# Patient Record
Sex: Female | Born: 1960 | Race: Black or African American | Hispanic: No | Marital: Single
Health system: Southern US, Community
[De-identification: ages and names within clinical notes are randomized; demographics above are authoritative.]

---

## 2003-02-03 ENCOUNTER — Inpatient Hospital Stay (HOSPITAL_COMMUNITY): Admission: AD | Admit: 2003-02-03 | Discharge: 2003-02-05 | Payer: Self-pay | Admitting: *Deleted

## 2003-02-04 ENCOUNTER — Encounter: Payer: Self-pay | Admitting: *Deleted

## 2003-02-22 ENCOUNTER — Inpatient Hospital Stay (HOSPITAL_COMMUNITY): Admission: RE | Admit: 2003-02-22 | Discharge: 2003-02-24 | Payer: Self-pay | Admitting: *Deleted

## 2003-03-23 ENCOUNTER — Ambulatory Visit (HOSPITAL_COMMUNITY): Admission: RE | Admit: 2003-03-23 | Discharge: 2003-03-23 | Payer: Self-pay | Admitting: *Deleted

## 2003-03-23 ENCOUNTER — Encounter: Payer: Self-pay | Admitting: *Deleted

## 2006-06-26 ENCOUNTER — Ambulatory Visit: Payer: Self-pay | Admitting: Internal Medicine

## 2006-12-11 ENCOUNTER — Ambulatory Visit: Payer: Self-pay | Admitting: Internal Medicine

## 2006-12-17 ENCOUNTER — Ambulatory Visit (HOSPITAL_COMMUNITY): Admission: RE | Admit: 2006-12-17 | Discharge: 2006-12-17 | Payer: Self-pay | Admitting: Internal Medicine

## 2006-12-22 ENCOUNTER — Encounter (INDEPENDENT_AMBULATORY_CARE_PROVIDER_SITE_OTHER): Payer: Self-pay | Admitting: Internal Medicine

## 2007-03-10 ENCOUNTER — Telehealth (INDEPENDENT_AMBULATORY_CARE_PROVIDER_SITE_OTHER): Payer: Self-pay | Admitting: Internal Medicine

## 2007-04-27 ENCOUNTER — Encounter (INDEPENDENT_AMBULATORY_CARE_PROVIDER_SITE_OTHER): Payer: Self-pay | Admitting: Internal Medicine

## 2007-12-11 ENCOUNTER — Encounter (INDEPENDENT_AMBULATORY_CARE_PROVIDER_SITE_OTHER): Payer: Self-pay | Admitting: Internal Medicine

## 2007-12-11 ENCOUNTER — Telehealth (INDEPENDENT_AMBULATORY_CARE_PROVIDER_SITE_OTHER): Payer: Self-pay | Admitting: *Deleted

## 2007-12-14 ENCOUNTER — Ambulatory Visit (HOSPITAL_COMMUNITY): Admission: RE | Admit: 2007-12-14 | Discharge: 2007-12-14 | Payer: Self-pay | Admitting: Internal Medicine

## 2007-12-14 ENCOUNTER — Ambulatory Visit: Payer: Self-pay | Admitting: Internal Medicine

## 2007-12-14 DIAGNOSIS — I10 Essential (primary) hypertension: Secondary | ICD-10-CM | POA: Insufficient documentation

## 2007-12-14 DIAGNOSIS — J309 Allergic rhinitis, unspecified: Secondary | ICD-10-CM | POA: Insufficient documentation

## 2007-12-15 ENCOUNTER — Telehealth (INDEPENDENT_AMBULATORY_CARE_PROVIDER_SITE_OTHER): Payer: Self-pay | Admitting: *Deleted

## 2007-12-16 ENCOUNTER — Encounter (INDEPENDENT_AMBULATORY_CARE_PROVIDER_SITE_OTHER): Payer: Self-pay | Admitting: Internal Medicine

## 2008-04-05 ENCOUNTER — Encounter (INDEPENDENT_AMBULATORY_CARE_PROVIDER_SITE_OTHER): Payer: Self-pay | Admitting: Internal Medicine

## 2008-04-13 ENCOUNTER — Telehealth (INDEPENDENT_AMBULATORY_CARE_PROVIDER_SITE_OTHER): Payer: Self-pay | Admitting: *Deleted

## 2008-04-13 ENCOUNTER — Ambulatory Visit: Payer: Self-pay | Admitting: Internal Medicine

## 2008-04-13 DIAGNOSIS — R109 Unspecified abdominal pain: Secondary | ICD-10-CM

## 2008-04-13 DIAGNOSIS — K8020 Calculus of gallbladder without cholecystitis without obstruction: Secondary | ICD-10-CM | POA: Insufficient documentation

## 2008-04-14 ENCOUNTER — Ambulatory Visit (HOSPITAL_COMMUNITY): Admission: RE | Admit: 2008-04-14 | Discharge: 2008-04-14 | Payer: Self-pay | Admitting: Internal Medicine

## 2008-04-22 ENCOUNTER — Ambulatory Visit (HOSPITAL_COMMUNITY): Admission: RE | Admit: 2008-04-22 | Discharge: 2008-04-22 | Payer: Self-pay | Admitting: General Surgery

## 2008-04-22 ENCOUNTER — Encounter (INDEPENDENT_AMBULATORY_CARE_PROVIDER_SITE_OTHER): Payer: Self-pay | Admitting: General Surgery

## 2008-12-13 ENCOUNTER — Telehealth (INDEPENDENT_AMBULATORY_CARE_PROVIDER_SITE_OTHER): Payer: Self-pay | Admitting: Internal Medicine

## 2008-12-13 ENCOUNTER — Ambulatory Visit: Payer: Self-pay | Admitting: Internal Medicine

## 2008-12-14 ENCOUNTER — Ambulatory Visit (HOSPITAL_COMMUNITY): Admission: RE | Admit: 2008-12-14 | Discharge: 2008-12-14 | Payer: Self-pay | Admitting: Internal Medicine

## 2009-03-09 ENCOUNTER — Telehealth (INDEPENDENT_AMBULATORY_CARE_PROVIDER_SITE_OTHER): Payer: Self-pay | Admitting: Internal Medicine

## 2009-03-14 ENCOUNTER — Encounter (INDEPENDENT_AMBULATORY_CARE_PROVIDER_SITE_OTHER): Payer: Self-pay | Admitting: Internal Medicine

## 2009-03-15 ENCOUNTER — Telehealth (INDEPENDENT_AMBULATORY_CARE_PROVIDER_SITE_OTHER): Payer: Self-pay | Admitting: Internal Medicine

## 2009-05-02 ENCOUNTER — Telehealth (INDEPENDENT_AMBULATORY_CARE_PROVIDER_SITE_OTHER): Payer: Self-pay | Admitting: *Deleted

## 2009-06-05 ENCOUNTER — Ambulatory Visit: Payer: Self-pay | Admitting: Internal Medicine

## 2009-06-05 DIAGNOSIS — R635 Abnormal weight gain: Secondary | ICD-10-CM | POA: Insufficient documentation

## 2009-06-05 DIAGNOSIS — R5381 Other malaise: Secondary | ICD-10-CM

## 2009-06-05 DIAGNOSIS — R5383 Other fatigue: Secondary | ICD-10-CM

## 2009-06-06 LAB — CONVERTED CEMR LAB
BUN: 14 mg/dL (ref 6–23)
Basophils Absolute: 0 10*3/uL (ref 0.0–0.1)
Basophils Relative: 0 % (ref 0–1)
Calcium: 9.9 mg/dL (ref 8.4–10.5)
Creatinine, Ser: 0.92 mg/dL (ref 0.40–1.20)
Eosinophils Absolute: 0.2 10*3/uL (ref 0.0–0.7)
Eosinophils Relative: 4 % (ref 0–5)
HCT: 40.6 % (ref 36.0–46.0)
Lymphs Abs: 1.6 10*3/uL (ref 0.7–4.0)
MCHC: 33.5 g/dL (ref 30.0–36.0)
MCV: 88.5 fL (ref 78.0–100.0)
Neutrophils Relative %: 57 % (ref 43–77)
Platelets: 210 10*3/uL (ref 150–400)
RDW: 14.1 % (ref 11.5–15.5)
WBC: 5.1 10*3/uL (ref 4.0–10.5)

## 2011-01-13 ENCOUNTER — Encounter: Payer: Self-pay | Admitting: Internal Medicine

## 2011-05-07 NOTE — Op Note (Signed)
Dana Vazquez, Dana Vazquez              ACCOUNT NO.:  [id number]   MEDICAL RECORD NO.:  [id number]          PATIENT TYPE:  AMB   LOCATION:  DAY                           FACILITY:  [facility]   PHYSICIAN:  Tilford Pillar, MD      DATE OF BIRTH:  04/28/1961   DATE OF PROCEDURE:  04/22/2008  DATE OF DISCHARGE:                               OPERATIVE REPORT   PREOPERATIVE DIAGNOSES:  1. Acute cholecystitis.  2. Cholelithiasis.   POSTOPERATIVE DIAGNOSES:  1. Acute cholecystitis.  2. Cholelithiasis.   PROCEDURE:  Laparoscopic cholecystectomy.   SURGEON:  Tilford Pillar, MD.   ANESTHESIA:  General endotracheal, local anesthetic 1% Sensorcaine  plain.   SPECIMEN:  Gallbladder.   ESTIMATED BLOOD LOSS:  Less than 100 mL.   INDICATIONS:  The patient is a 50 year old female who presented to my  office with a history of right upper quadrant epigastric abdominal pain.  She had had prior workup already demonstrating the presence of  gallbladder stones.  Based on her symptomatology and suspected acute  cholecystitis, risks, benefits, and alternatives of laparoscopic,  possible open cholecystectomy were discussed at length with the patient  including, but not limited to risk of bleeding, infection, bile leak,  small bowel injury, bile duct injury as well as the possibility of  intraoperative cardiac or pulmonary events.  The patient's questions and  concerns were addressed and the patient was consented for the planned  procedure.   OPERATION:  The patient was taken to the operating and was placed in  supine position on the operating table, at which time a general  anesthetic was administered.  Once the patient was asleep, she was  endotracheally intubated by anesthesia.  At this point, the patient's  abdomen was prepped and draped in the usual fashion.  A stab incision  was created supraumbilically with an #11 blade scalpel.  Additional  dissection down through subcuticular tissue was carried out  using a  Kocher clamp, which was utilized to grasp the anterior abdominal wall  fascia and lifted anteriorly.  A Veress needle was inserted.  Saline  drop test was utilized to confirm intraperitoneal placement, and at this  point, pneumoperitoneum was initiated.  Once sufficient pneumoperitoneum  was obtained, an 11-mm trocar was placed over a laparoscope allowing  visualization of the trocar entering into the peritoneal cavity.  At  this point, the inner cannula was removed.  The laparoscope was  reinserted.  There was no evidence of any trocar or Veress needle  placement injury.  At this time, the remaining trocars were placed in a  similar fashion with a skin incision and placement of the trocars under  direct visualization.  An 11-mm trocar was placed in the epigastrium, a  5-mm trocar was placed in the midline between the two 11-mm trocars, and  a 5-mm trocar was placed in the right lateral abdominal wall.  At this  point, the patient was placed in a reverse Trendelenburg left lateral  decubitus position.  There were multiple adhesions of omental fat around  the gallbladder, also adherent to the right lobe of the  liver.  Majority  of these were taken down with combination of blunt and electrocautery  dissection to prevent capsular tear on the liver.  The gallbladder was  acutely inflamed.  It was thickened and was difficult to grasp with a  grasper.  Due to this, a Weck needle was brought to this field and was  utilized to aspirate the gallbladder.  A small amount of thick, somewhat  purulent-appearing fluid was obtained.  This did allow for some  decompression of the gallbladder and allowed for better grasping of the  fundus, which was then elevated up and over the right lobe of the liver.  The infundibulum was identified.  Blunt dissection with a [place]  dissector was utilized to dissect the cystic duct free.  Once this was  free, a window was created around it allowing  visualization of the  entire cystic duct entering into the infundibulum of the gallbladder.  Similarly, the cystic artery was also identified.  A window was created  behind this.  Two EndoClips were placed proximally on the cystic artery  and one distally and the cystic artery was divided between two most  distal clips .  At this point, the cystic duct was similarly ligated  with the EndoClips, three proximally and one distally, and the cystic  artery was divided between two most distal clips.  At this point,  electrocautery was brought to the field was utilized to dissect the  gallbladder free from the gallbladder fossa.  Upon freeing the  gallbladder, it was placed in an Endocatch bag, which was placed up and  over the right lobe of the liver.  At this point, electrocautery was  utilized to obtain hemostasis in the gallbladder fossa.  A suction  irrigator was utilized to irrigate the field with copious amount of  sterile saline until the returning aspirate was relatively clear.  A  piece of Surgicel was placed into the gallbladder fossa to assist with  any hemostasis, and at this point, the patient was returned to a supine  position and the attention was turned to closure.   At this point, using an Endoclose suture passing device, a 2-0 Vicryl  suture was passed through both 11-mm trocar sites.  With the sutures in  place, the gallbladder was grasped and it was attempted to be removed  through the epigastric trocar site.  During this, additional sharp and  blunt dissection was required to enlarge the epigastric trocar site  sufficiently to allow removal of the gallbladder.  During this process,  both the previously placed Vicryl suture as well as the Endocatch bag  were inadvertently cut.  Both were removed.  The gallbladder was removed  eventually through the epigastric trocar site outside of the Endocatch  bag.  Due to this, the wound was copiously irrigated with sterile saline  and  the pneumoperitoneum was evacuated.  A 0 Vicryl suture was utilized  to reapproximate the fascia at the epigastric trocar sites  in a running  continuous fashion.  The wound was again irrigated until the returning  aspirate was clear and the umbilical Vicryl suture was secured.  At this  point, the local anesthetic was instilled.  A 4-0 Monocryl was utilized  in a running subcuticular suture to reapproximate the skin edges at all  4 trocar sites.  The skin was washed and dried with a moistened dry  towel.  Benzoin was applied around the incision.  Half-inch Steri-Strips  were placed.  Drapes removed.  The patient was allowed to come out of  general aesthetic and transferred back to a regular hospital bed.  She  was transferred to the Postanesthetic Care Unit in stable condition.  At  the conclusion of the procedure, all instrument, sponge, and needle  counts were correct.  The patient tolerated the procedure well.      Tilford Pillar, MD  Electronically Signed     BZ/MEDQ  D:  04/22/2008  T:  04/23/2008  Job:  [id number]   cc:   Erle Crocker, M.D.

## 2011-05-07 NOTE — H&P (Signed)
Dana Vazquez, Dana Vazquez              ACCOUNT NO.:  [id number]   MEDICAL RECORD NO.:  [id number]          PATIENT TYPE:  AMB   LOCATION:  DAY                           FACILITY:  [facility]   PHYSICIAN:  Tilford Pillar, MD      DATE OF BIRTH:  1961-03-01   DATE OF ADMISSION:  DATE OF DISCHARGE:  LH                              HISTORY & PHYSICAL   CHIEF COMPLAINT:  Gallstones.   HISTORY OF PRESENT ILLNESS:  The patient is a 50 year old female who has  a history of recently increasing episodes of emesis.  She has denied any  hematemesis.  She has had no fever or chills.  She has had no sick  contacts.  This has increased over the last 1 week.  She has had  increasing abdominal pain as well.  She actually states that the pain  typically comes on prior to her bouts of nausea.  Pain is localized to  the epigastrium and right upper quadrant.  There is no  additional  radiation.  There is no history of prior similar pain in the past.  She  also states that her pain sometimes gets better with an episode of  emesis, although it does not completely resolve it.  She has had noted  some bloating, but states that this has actually improved after a  hysterectomy in 2003.  She does have a history of constipation  especially over the last 3 weeks.  She denies any melena or  hematochezia.   PAST MEDICAL HISTORY:  Pertinent for hypertension.   PAST SURGICAL HISTORY:  1. She has had a prior myotomy.  2. Fibroid resection.  3. Hysterectomy.   MEDICATIONS:  She is on Climara patch.   ALLERGIES:  1. PENICILLIN.  She was told that it caused an anaphylactic reaction      when she was a child.  2. She is also allergic to [facility].   SOCIAL HISTORY:  No tobacco, no alcohol use.  She had one distant  pregnancy.   FAMILY HISTORY:  Positive for family history of biliary disease in the  mom's sister and a grandmother.  No history of biliary cancers or GI  cancers.   REVIEW OF SYSTEMS:  CONSTITUTIONAL:   Unremarkable.  EYES:  Unremarkable.  EARS/NOSE/THROAT: Occasional rhinorrhea associated with allergic  rhinitis.  RESPIRATORY:  Unremarkable.  CARDIOVASCULAR:  Unremarkable.  GASTROINTESTINAL:  As per HPI, otherwise unremarkable.  GENITOURINARY:  Unremarkable.  MUSCULOSKELETAL:  Unremarkable.  SKIN:  Unremarkable.  ENDOCRINE:  Unremarkable.  NEURO:  Unremarkable.   PHYSICAL EXAMINATION:  GENERAL:  The patient is a healthy-appearing,  moderately obese female who is calm on presentation.  She is not in any  acute distress.  She is alert and oriented x3.  HEENT:  Scalp:  No deformities, no masses.  Eyes:  Pupils are equal,  round, reactive.  Extraocular movements are intact.  No scleral icterus  or conjunctival pallor is noted.  Oral mucosa is pink.  Normal  occlusion.  NECK:  Trachea is midline.  No cervical lymphadenopathy is appreciated.  PULMONARY:  Unlabored respirations.  No  wheezes, no crackles.  She is  clear to auscultation bilaterally in both bilateral lung fields.  CARDIOVASCULAR:  Regular rate and rhythm.  No murmurs or gallops are  appreciated on presentation today.  EXTREMITIES:  She has 2+ radial pulses bilaterally.  ABDOMEN:  She has positive bowel sounds.  Abdomen is soft, obese.  She  does have right upper quadrant tenderness and epigastric tenderness.  She does have a Murphy sign elicited with deep inspiration.  No hernias  or masses are apparent.  SKIN:  Warm and dry.   DIAGNOSTICS:  The patient does have a right upper quadrant ultrasound  which demonstrates positive stones.   ASSESSMENT/PLAN:  Cholelithiasis.  Based on the patient's current workup  and her clinical presentation, I am suspicious that the majority of her  symptomatology is related to her gallbladder.  However, with the  symptomatology of improving pain with emesis, it is slightly less  characteristic for biliary disease.  As the patient has not had any  upper endoscopy or upper GI tract workup to  date, this was discussed  with the patient as the possible etiology such as gastritis or peptic  ulcer disease.  At this point, I have discussed at length the risks,  benefits and alternatives of a laparoscopic possible open  cholecystectomy including, but not limited to bleeding, infection, bile  leak, bile duct injury, small bowel injury as well as possibility of  intraoperative pulmonary cardiac events.  At this time, I do think it is  reasonable to proceed with laparoscopic possible open cholecystectomy  with the understanding that should she has continued to have any upper  intestinal symptomatology, additional workup with gastroenterology be  undertaken.  The patient is aware of this and does wish to proceed.      Tilford Pillar, MD  Electronically Signed     BZ/MEDQ  D:  04/20/2008  T:  04/20/2008  Job:  [id number]   cc:   Tilford Pillar, MD  Fax: [phone]   Erle Crocker, M.D.

## 2011-05-10 NOTE — H&P (Signed)
NAME:  Dana Vazquez, Dana Vazquez                        ACCOUNT NO.:  [id number]   MEDICAL RECORD NO.:  [id number]                   PATIENT TYPE:  INP   LOCATION:  A418                                 FACILITY:  [facility]   PHYSICIAN:  Langley Gauss, M.D.                DATE OF BIRTH:  1961/03/01   DATE OF ADMISSION:  02/03/2003  DATE OF DISCHARGE:                                HISTORY & PHYSICAL   HISTORY OF PRESENT ILLNESS:  This is a 50 year old gravida 0, para 0 who is  referred to the office on February 03, 2003.  Consultation is requested by  [facility].  The patient's chief complaint is that of heavy vaginal  bleeding that a.m. and passed a fist sized mass this morning.  The bleeding  actually started the p.m. prior.  The patient states she had used an entire  bag of plus sized maxi pads and was wearing an adult diaper upon  presentation.  She also felt somewhat dizzy and weak with poor energy level.   The patient states she typically bleeds for eight to 14 days each month with  both bright red bleeding as well as passage of clots.  The patient's history  is that she is well-known to me with previously identified uterine fibroids  with significant menometrorrhagia resulting in previous episodes of anemia.  The patient was most recently seen April 2003 at which time a complete  evaluation was performed.  At that time she had complained of progressively  heavier menstrual periods for one and one-half years, typically bleeding for  eight days with passage of golf ball sized clots.  Hematocrit at that time  was 32 and ultrasound revealed multiple uterine fibroids measuring as large  as 5 cm in diameter.  She was treated short-term at that time with Ovcon 35  followed by Provera 10 mg p.o. daily in an effort to result in shrinkage of  the leiomyoma while she made preparations for surgical management.  The  patient at that time was unprepared to surgery due to insurance  restrictions.   Thus, she continued with the Provera 10 mg p.o. daily for  several months.  Thereafter, was lost to follow-up and we did not see her  again until we were contacted to see her on February 03, 2003 at which time  she was noted to be uncontrollably bleeding with a hemoglobin of 5.1 in the  office and symptomatic anemia.   The patient's complete history is reviewed.  Initially she was seen June  2001 as an ER unassigned patient with significant symptomatic anemia with a  hemoglobin of 6 and pelvic ultrasound revealing multiple fibroids, largest  being 8.6 cm in diameter.  The patient was transfused 2 units at that time  in preparation for proceeding with TAH, BSO.  However, after stabilization  for the planned surgery the patient declined to proceed with the  hysterectomy stating  that she was concerned that hysterectomy would spoil  her nature.  Thus, she considered alternative therapies and actually went to  [facility] at which time fibroid artery embolization was performed there.  The patient states that this gave her significant improvement for only three  months' duration after which time the heavy bleeding resumed and a repeat  MRI, in fact, showed that not all the fibroids had shrunk.  The patient thus  has had somewhat significant menometrorrhagia since that point in time.  The  patient's past surgical history pertinent for a myomectomy in 1990.  At that  time patient was interested in maintaining her fertility potential.  However, now at age 67 with symptomatic anemia and enlarging fibroids,  patient is no longer concerned with childbearing potential and she is  prepared at this time to proceed with definitive surgical management.   ALLERGIES:  The patient states she is allergic to PENICILLIN which gives her  hives and TEQUIN.   PAST OB HISTORY:  She is a gravida 0, para 0.   REVIEW OF SYSTEMS:  Pertinent for no significant weight change.  She is  complaining of heavy vaginal  bleeding with passage of clots and cramping.  She is symptomatic with significant weakness and occasional dizziness.  She  denies any chest pain.  She denies any symptoms of genuine stress urinary  incontinence.  Denies any blood stools or any problems with constipation or  diarrhea.   PHYSICAL EXAMINATION:  GENERAL:  Obese black female.  VITAL SIGNS:  Most recent weight available 169.  Blood pressure is 117/57,  pulse rate of 95, respiratory rate 18, temperature 97.5.  HEENT:  Negative.  No adenopathy.  Mucous membranes are moist.  NECK:  Supple.  Thyroid is nonpalpable.  LUNGS:  Clear.  CARDIOVASCULAR:  Regular rate and rhythm.  ABDOMEN:  Soft and nontender.  There is obvious palpable pelvic mass  primarily to the left of the midline extending about 16 weeks size.  There  is a previous Pfannenstiel incision from prior myomectomy.  EXTREMITIES:  Normal.  PELVIC:  Normal external genitalia.  No lesions or ulcerations identified.  Bladder is well supported.  Cervix is noted to be without lesions.  Bimanual  examination reveals a lobular uterus irregular in contour, predominantly  largest left portion of the fundus at 14-16 weeks size.  The adnexa, of  course, are not palpably separatable from the pelvic mass which is presumed  to be a leiomyoma.   LABORATORIES:  Previous chart is reviewed which does reveal a hemoglobin of  10.0 April 15, 2002.  Pelvic ultrasound performed June 11, 2000 revealed  normal appearing ovaries bilaterally, multiple uterine fibroids measuring up  to 8.3 cm in size.  Pertinently, this was performed prior to the  embolization of the fibroids.  She did have a chest x-ray performed October 17, 1999 with findings of minimal peribronchial thickening.  Laboratory  studies this admission reveal reticulocyte count of 2.0.  Electrolytes  reveal slightly low potassium of 3.3.  Serum hCG is negative.  Hemoglobin 5.4, hematocrit 18.4, MCV 61.6, platelet count 97,000.  O+  blood type with  negative antibody screening.  On examination patient was noted to have  active vaginal bleeding with cervical clots present within the posterior  vaginal vault and moderate amount of dark red bleeding coming from the  cervix itself.   ASSESSMENT:  A 50 year old gravida 0, para 0 now with recurrence and  enlargement of previously identified uterine fibroids.  The  patient has  significant bleeding from there over long-time duration and have now  resulted in significant anemia with a hemoglobin of 5.4.  The patient is  prepared to proceed with definitive surgical management at this time.  However, she will require medical stabilization on an emergent basis prior  to this.  She is referred to [facility] for admission at which time  she will be immediately transfused and be hydrated with intravenous fluids.  Will make an effort to short-term stop her active bleeding by treating her  with Premarin 25 mg intravenous q.6h.  A pelvic ultrasound can be performed  on February 04, 2003 in an effort to ascertain with certainty whether this  is just uterine fibroids or involves any ovarian etiology.   If the patient continues to have active bleeding during this hospitalization  period, may likely proceed with definitive surgical therapy on February 05, 2002 with hysterectomy performed after appropriate transfusion is  administered.  If the patient were to stop having vaginal bleeding she could  be discharged and the surgery scheduled in the very near future.                                               Langley Gauss, M.D.    DC/MEDQ  D:  02/04/2003  T:  02/04/2003  Job:  [id number]

## 2011-05-10 NOTE — H&P (Signed)
NAME:  Dana Vazquez, Dana Vazquez                        ACCOUNT NO.:  [id number]   MEDICAL RECORD NO.:  [id number]                   PATIENT TYPE:  INP   LOCATION:  A331                                 FACILITY:  [facility]   PHYSICIAN:  Langley Gauss, M.D.                DATE OF BIRTH:  04-Jan-1961   DATE OF ADMISSION:  02/22/2003  DATE OF DISCHARGE:  02/24/2003                                HISTORY & PHYSICAL   HISTORY OF PRESENT ILLNESS:  The patient is a 50 year old gravida 0, para 0  with a diagnosis of irregular menses with resultant anemia and large uterine  fibroids.  The patient admitted at this time for definitive surgical  management to consist of a TAH/BSO.  The patient understands that removal of  the ovaries will result in onset of surgical menopause requiring initiation  of estrogen therapy to avoid symptoms associated with surgical menopause.  The patient's history is pertinent for a previous myomectomy in 1990.  Thereafter, the patient was seen again in July 2001, at which time she was  again noted to have problems with the fibroids and vaginal bleeding.  She  was transfused 2 units at that time in anticipation of proceeding with  surgery; however, the patient elected to travel to [place], at which time  uterine artery embolization was performed.  The patient states that her  bleeding never completely regulated following the uterine artery  embolization but during the past year she has had increasing problems with  very heavy and prolonged menses and feeling weak and dizzy.  In fact, this  is what prompted her most recent visit to the office due to the symptomatic  anemia.   The patient is status post admission February 03, 2003 to February 05, 2003  for blood transfusions.  She was treated with 3 units of packed red blood  cells at that time.  Her initial hemoglobin prior to transfusion was 5.4.  The patient had been treated with 400 mg of IM Depo-Provera at time of  discharge in an effort to suppress the fibroids and vaginal bleeding.  This  functioned very well, such that at time of discharge the patient has only  had over the past several weeks episodes of spotting.   ALLERGIES:  The patient states she is allergic to PENICILLIN, which gives  her hives, and also TEQUIN.   MEDICATIONS:  1. Hemocyte-F p.o. daily.  2. Depo-Provera 400 mg IM received February 04, 2003.  3. The patient has just completed a Z-Pak for upper respiratory infection.   PHYSICAL EXAMINATION:  GENERAL:  She is noted to be a pleasant black female  in no acute distress.  VITAL SIGNS:  Weight is 169, blood pressure 114/70, pulse rate of 80,  respiratory rate of 16.  HEENT:  Negative.  No adenopathy.  Neck is supple.  Mucous membranes are  moist.  Thyroid is nonpalpable.  LUNGS:  Clear.  CARDIOVASCULAR:  Regular rate and rhythm.  ABDOMEN:  Soft and nontender.  The patient does have a palpable 16-week size  pelvic mass, irregular lobular in contour on the left greater than on the  right.  She has previous Pfannenstiel incision from prior myomectomy.  PELVIC:  Normal external genitalia.  No lesions or ulcerations identified.  The bladder is well supported and all urethras attached.  The cervix is  without lesions with a class I Pap smear.  Bimanual examination confirms the  16-week size of fibroids with at least two palpable fibroids, one within the  left side of the uterine fundus and a second within the midportion of the  uterus.  Also noted is an exophytic fibroid associated with the left uterine  fundus.   LABORATORY DATA:  Laboratory studies at time of initial presentation:  Hemoglobin 11.6, hematocrit 36.5.  Platelet count has improved to 367,000.   ASSESSMENT:  A 50 year old black female who desires definitive surgical  management for her symptomatic fibroids which have on two occasions resulted  in severe anemia.  The patient is prepared to proceed with total  abdominal  hysterectomy/bilateral salpingo-oophorectomy.  Risks and benefits of surgery  were discussed with the patient at time of evaluation in the office and we  will be proceeding on February 22, 2003.                                               Langley Gauss, M.D.    DC/MEDQ  D:  03/01/2003  T:  03/01/2003  Job:  [id number]

## 2011-05-10 NOTE — Discharge Summary (Signed)
NAME:  Dana Vazquez, Dana Vazquez                        ACCOUNT NO.:  [id number]   MEDICAL RECORD NO.:  [id number]                   PATIENT TYPE:  INP   LOCATION:  A331                                 FACILITY:  [facility]   PHYSICIAN:  Langley Gauss, M.D.                DATE OF BIRTH:  [birth year]   DATE OF ADMISSION:  02/22/2003  DATE OF DISCHARGE:  02/24/2003                                 DISCHARGE SUMMARY   PROCEDURE PERFORMED:  Total abdominal hysterectomy/bilateral salpingo-  oophorectomy.   DISCHARGE INSTRUCTIONS:  On 02/24/03, the patient was given a copy of the  standardized discharge instructions. She will follow up in the office in  three days' time for removal follow up staples from the Pfannenstiel  incision. JP drain was removed at the time of discharge.   DISCHARGE MEDICATIONS:  1. Mepergan one p.o. q.4-6h. p.r.n. postoperative pain.  2. The patient will continue with Hemocyte-F one p.o. daily.  3. An addition, she is started on Wellbutrin SR 150 mg po daily, stating she     is feeling mildly depressed and had previously done well taking     Wellbutrin.   PERTINENT LABORATORY DATA:  Initial hemoglobin and hematocrit 11.6/36.5; on  postoperative day #1, hemoglobin 10.2, hematocrit 31.6; and on postoperative  day #2, hemoglobin 7.8, hematocrit 24.1 with a platelet count of 227,000.  Electrolytes are within normal limits. Liver function tests within normal  limits. O+ blood type. Negative antibody screen.   HOSPITAL COURSE:  The patient had an uncomplicated TAH/BSO performed on  02/22/03 with removal of very large uterine fibroids. Technically difficulty  due to the large fibroids limiting visualization and mobility.  Postoperatively, the patient had clear yellow urine from her Foley catheter.  She did have a Climara patch placed in the recovery room. The patient also  had a JP drain within the subcutaneous base. Postoperatively, the patient  remained afebrile. Vital signs  remained stable. She had no postoperative  bleeding following equilibration with the hemoglobin of 7.8. The patient did  very well with no significant signs or symptoms of anemia, undoubtedly due  to her preceding history of severe anemia with a hemoglobin of 5.4 on 2/12.  The patient was able to advance her diet to regular general. At the time of  discharge, the patient is ambulatory, tolerating a regular general diet. She  has began passing flatus. The patient abdomen is soft and nontender,  nondistended, and the patient is doing well on p.o. Mepergan. Pertinently,  the patient was difficulty to manage postoperatively as she had complaints  of itching, either from Toradol, Buprenex, or thereafter p.o. Tylox. Thus,  at time of discharge, Mepergan was chosen for postoperative pain relief.   DISPOSITION:  As stated previously. The patient is to follow up in three  days' time for removal of staples. Final pathology is currently pending from  the operative procedure.  Langley Gauss, M.D.   DC/MEDQ  D:  03/01/2003  T:  03/01/2003  Job:  [id number]

## 2011-05-10 NOTE — Op Note (Signed)
NAME:  Dana Vazquez, Dana Vazquez                        ACCOUNT NO.:  [id number]   MEDICAL RECORD NO.:  [id number]                   PATIENT TYPE:  INP   LOCATION:  A331                                 FACILITY:  [facility]   PHYSICIAN:  Langley Gauss, M.D.                DATE OF BIRTH:  11-Mar-1961   DATE OF PROCEDURE:  02/22/2003  DATE OF DISCHARGE:                                 OPERATIVE REPORT   PREOPERATIVE DIAGNOSES:  1. Irregular menses.  2. Anemia secondary to #1.  3. Multiple uterine leiomyoma.   POSTOPERATIVE DIAGNOSES:  1. Irregular menses.  2. Anemia secondary to #1.  3. Multiple uterine leiomyoma.   PROCEDURE:  1. Total abdominal hysterectomy.  2. Bilateral salpingo-oophorectomy.   SURGEON:  Roylene Reason. Lisette Grinder, M.D.   ESTIMATED BLOOD LOSS:  850 mL   SPECIMENS:  For permanent section only, include:  Fundal portion of uterus  with 2 large fibroids; uterine body, as well as the cervix, as a separate  specimen; and then both left and right tubes and ovaries, as a separate  specimen.   ANESTHESIA:  General endotracheal   DRAINS:  Foley catheter to straight drainage.  JP catheter within the  subcutaneous space.   FINDINGS:  Extensive adhesive disease secondary to a previous myomectomy.  There was noted to be adhesions between large bowel and the posterior aspect  of the uterus.  In addition there was noted to be omental adhesions to the  anterior abdominal wall.  Extensive dissection was performed with the  procedure made very difficult with the adhesive disease which had to be  lysed.  In addition, the very large fibroids limited mobilization and  visualization requiring performance of an initial excision of the 2 largest  fibroids from the fundus, followed by the remainder of the hysterectomy,  followed by proceeding with the bilateral salpingo-oophorectomy.  Total  operating time:  Starting at 0730 with leaving the OR at 10:15 a.m. for a  total of 2 hours and 45 minutes  for a difficult hysterectomy which typically  would have required only 1 hour and 30 minutes.   SUMMARY:  The patient was taken to the operating room.  Vital signs were  stable.  The patient underwent uncomplicated induction of general  endotracheal anesthesia after which time she was prepped and draped in the  usual sterile manner.  Foley catheter placed to straight drainage with  findings of clear yellow urine.  Abdominal examination revealed the large  pelvic mass, largest to the left of the midline extending to about 16 weeks  size.  A sharp knife was then used to incise a Pfannenstiel incision through  the skin through the previous operative site; dissected down to the fascial  plane utilizing a sharp knife, cauterizing bleeders along the way.  The  fascia was then incised in a transverse curvilinear manner while sharply  dissecting off the underlying rectus muscles. The  fascial edges were grasped  using straight Kocher clamps and the fascia was dissected off the underlying  rectus muscle in the midline both superiorly and inferiorly.  The multiple  small bleeders which were encountered were cauterized aggressively to  minimize intraoperative blood loss.  The rectus muscle was then bluntly  separated and dissected down to the pubic symphysis in the midline.  The  peritoneum was then elevated utilizing hemostats and the peritoneal cavity  was atraumatically, sharply entered with the peritoneal incision then  extended superiorly and inferiorly.   At this point in time extensive adhesive disease was encountered between  omentum and anterior abdominal wall requiring dissection with the Metzenbaum  scissors in the avascular plane to completely immobilize omentum.  Examination of the pelvic cavity, at this time revealed extensive adhesive  disease between large bowel and omentum and the posterior aspect of the  uterus.  Neither ovary is visualized secondary to adhesive disease keeping   the ovaries adhered to the posterior peritoneum.   With the limited visualization I did have, I proceeded in the usual manner  by identifying the round ligament.  The right round ligament is clamped with  a Kelly clamp followed by a suture ligature of #0 Vicryl in a Heaney  fashion, followed by sharp transection of the round ligament.  The  infundibulopelvic ligament is not identified, rather the utero-ovarian  ligament is identified.  A window is created in the free space of the broad  ligament and then this is doubly clamped with Kelly clamps followed by  doubly ligating with a suture ligature, first with #0 Vicryl free-tie,  followed by a suture ligature of #0 Vicryl in a Heaney fashion.   At this point in time, examination of the left side of the pelvic cavity  revealed the large fibroids identified there.  The two largest fibroids were  estimated at 7 cm in diameter x 5 cm in diameter x 8 cm in diameter. These  extended off the left fundal portion of the uterus and noted to be  exophytic.  I was unable to elevate these through the abdominal incision  which allows the uterus to be retracted anteriorly and sharp dissection  performed in the posterior aspect of the uterus to mobilize large bowel and  omentum off the posterior aspect of the uterus.   I then proceeded to identify the junction of the two largest fibroids with  the uterus itself.  This was a very thick vascular band which required  sequential clamping with a curved Heaney clamp followed by 4 separate  ligatures of #0 Vicryl in a Heaney fashion to completely excise this large  exophytic area of all fibroids.  These were handed off as a separate  specimen.  This then allows the left round ligament to be identified which  is then clamped with a Kelly clamp to control backbleeding, followed by a  suture ligature of #0 Vicryl in a Heaney fashion.  The left round ligament is then transected.  The left ovary is not identified.   The left utero-  ovarian ligament is doubly clamped with Kelly clamps, followed by being  doubly ligated with free-tie of #0 Vicryl followed by suture ligature of #0  Vicryl.   At this point in time, I was able to transect anterior across the lower  uterine segment to create a bladder flap in the vesicouterine fold and  utilizing sharp dissection and blunt manipulation this was mobilized towards  the distal aspect of  the cervix.  This extended bluntly down to the level of  the uterine vessels.  The right uterine vessel was clamped utilizing a Kelly  clamp to control backbleeding, followed by a curved Heaney clamp, followed  by a suture ligature of #0 Vicryl.  Some backbleeding was noted to occur as  the Kelly clamp slipped off  These required separate ligature with figure-of-  eights of #0 Vicryl on the uterus itself.  The left uterine vessel was  likewise identified and clamped with a curved Heaney clamp followed by a  suture ligature of #0 Vicryl in a simple fashion to secure the left uterine  vessel.  The cervix was then examined and noted to be very long with very  thick vascular tissue immediately adjacent to it.  On each side sequentially  straight Heaney clamps are utilized to secure first the cardinal ligaments  on each side followed by the uterosacral ligaments on each side.  This then  brought me down to the level of the vaginal angles.  Again, dissection was  performed anteriorly to be certain that the bladder flap was distal to the  cervix.  The right vaginal angle was clamped with a  curved Heaney clamp  followed by suture ligature of #0 Vicryl in a Heaney fashion.  The left  uterine vaginal angle was likewise secured in a similar fashion.  An  additional curved Heaney clamp was placed and secured, first on the right  then on the left to clamp distal to the cervix and clamped across the  vaginal cuff.  Each of these had been then secured with #0 Vicryl in a  Heaney fashion.  The entirety of the specimen is then removed.  The cervix  was examined and noted to be contained and connected to the uterus itself.   Examination of the secured vaginal cuff then revealed a very small arterial  bleeder near the left vaginal angle.  This is clamped with a Vanderbilt  clamp followed by a ligature of 2-0 Vicryl to secure hemostasis here.  In  addition there is small active venous bleeding from the midportion of the  vaginal cuff.  This is secured with a figure-of-eight suture of #0 Vicryl.  Irrigation is then performed until certainty is assured that the vaginal  cuff is then dry.   I then turned my attention to the removal of the ovaries.  Each of the  ovaries required sharp dissection to free it up from the posterior pelvic  peritoneum in the avascular plane.  After performing this each of the  infundibulopelvic pelvic ligaments had been identified and sequentially on each of these a curved Heaney clamp is placed followed by a suture ligature  of #0 Vicryl to secure hemostasis.  Great care is taken to remove the  entirety of the ovary, but at the same time remain very close to the ovary  to not involve the ureters which are noted to be in their normal anatomic  position in the lateral pelvic side wall.   After removal of the ovaries the infundibulopelvic ligaments were noted to  be dry and secure with no active vaginal bleeding.  Copious irrigation was  performed into the pelvic cavity until clear.  A piece of Surgicel is then  placed into the posterior pelvic peritoneum overlying the vaginal cuff.  The  Balfour retractor is then removed as well as the 3 moist packs which had  been utilized to mobilize out of the operative field.  Sponge  and instrument  counts are correct at this time.  The omentum is carefully examined for  evidence of any bleeding.  Several small venous bleeders are cauterized with  cautery in addition to this larger bleeders are required to be  clamped with  an endo______ clamp followed by free-tie of #0 Vicryl to secure hemostasis  on the omentum.   The peritoneal edges were then grasped using Kelly clamps.  The peritoneum  and overlying rectus muscle was then closed with a continuous running #0  chromic suture.  Again, irrigation is performed to assure hemostasis.  The  fascia was then closed with a continuous running, looped, #1 PDS suture to  secure the fascia.  The subcutaneous bleeders are cauterized.  A JP drain is  placed in the subcutaneous space with a separate exit wound to the left apex  of the incision.  Three interrupted #1 Maxon sutures are then placed through-  and-through the skin edges to function as retention-type sutures and the  skin is then closed utilizing skin staples.  To facilitate postoperative  analgesia a total of 20 mL of 0.5% bupivacaine plain is injected along the  entirety of the incision.  The patient tolerated the procedure well.  She  continues to drain clear yellow urine.  She has maintained adequate volume  status through the administration of crystalloid by the anesthesia staff.  No blood products are transfused.  JP drain appears to be draining well.  Foley catheter was draining clear yellow urine.  The patient is extubated  and taken to the recovery room in stable condition, at which time, operative  findings discussed with the patient's awaiting family.                                               Langley Gauss, M.D.    DC/MEDQ  D:  02/22/2003  T:  02/23/2003  Job:  [id number]

## 2011-05-10 NOTE — Discharge Summary (Signed)
NAME:  Dana Vazquez, Dana Vazquez                        ACCOUNT NO.:  [id number]   MEDICAL RECORD NO.:  [id number]                   PATIENT TYPE:  INP   LOCATION:  A418                                 FACILITY:  [facility]   PHYSICIAN:  Langley Gauss, M.D.                DATE OF BIRTH:  [birth year]   DATE OF ADMISSION:  02/03/2003  DATE OF DISCHARGE:  02/05/2003                                 DISCHARGE SUMMARY   DIAGNOSES:  1. Pelvic mass secondary to uterine leiomyoma.  2. Menometrorrhagia with anemia secondary to #1 secondary to blood loss.  3. Hypokalemia, corrected.  4. Thrombocytopenia.   DISPOSITION AT TIME OF DISCHARGE:  The patient is having minimal bleeding.  She will contact and return visit in our office in five days time, at which  time a CBC can be reassessed.  In addition, specifically will be looking at  her platelet count and her hemoglobin and hematocrit counts.   DISCHARGE MEDICATIONS:  1. Hemocyte-F one p.o. daily, #60 with no refill.  2. Tylox #20 with no refill for relief from uterine cramping.  3. In addition, the patient was treated during hospitalization with three     doses of 25 mg of IV Premarin to stop the active bleeding.  4. On 02/04/03 she received Depo-Provera 400 mg IM in an effort to induce an     atrophic endometrium.   PERTINENT LABORATORY STUDIES:  Initial labs on 02/03/03 revealed O+ blood  type with a negative antibody screen, hemoglobin 5.4, hematocrit 18.4, with  an MCV of 61.6, initial platelet count of 97,000.  The patient referred to  [facility] on 02/03/03.  She received 2 units of packed red blood cells and  intravenous fluids, and CBC was reassessed on 02/04/03, at which time her  hemoglobin was noted to be 7.3, hematocrit 23.7, with a platelet count of  69,000.  In addition, a potassium was obtained which revealed abnormal  electrolytes with a potassium of 3.3.  With these lab values in mind, the  patient was transfused a third unit of packed red  blood cells the p.m. of  02/04/03, and additional laboratory studies were requested.  She was started  on K-Tabs 40 mEq p.o. b.i.d. for the hypokalemia, but actually vomited  these.  Thus, she was treated only with 20 mEq of KCl per liter of IV  fluids.  On 02/04/03, the bleeding was noted to have markedly diminished.  She was having active vaginal bleeding, but passing only very small clots.  No large clots were identified.  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  An ultrasound obtained 02/04/03 revealed multiple  uterine fibroids, the largest being anterior, measured at 12.4 x 6.8 x 6.6  cm in size.  The left ovary was nonvisualized.  There was a thickened  endometrial  stripe at 1.9 cm.  The third unit was transfused on 12/05/03.  Late in the p.m. on 12/05/03, laboratory studies were again requested.  Final hemoglobin 8.0, hematocrit 25.0, with a platelet count of 73,000,  white count 6.0, MCV 71.4, potassium now normal at 4.1, fibrinogen normal at  238.  Bleeding time abnormal with normal value 2 to 8.5 minutes; hers was  noted to be greater than 15 minutes.  PT and PTT were within normal limits.  Pertinently on the peripheral smear, the platelets qualitatively appeared  decreased.  In addition, a reticulocyte count had been requested upon  admission and was noted to be 2.0%.  On 02/05/03, which is the day we had  hoped to proceed with total abdominal hysterectomy, this was not performed  secondary to patient's medical status with the thrombocytopenia abnormal  bleeding time; thus, with minimal active bleeding occurring at this time,  she was discharged to home to be followed closely as an outpatient with iron  replacement therapy.  Hopefully the Depo-Provera will suppress her  endometrium and prevent any additional heavy vaginal bleeding, such that her  bone marrow itself is able to  reproduce red cells and platelets.   As stated previously, the patient will follow up in five days time, at which  time we can reassess her laboratory values to make sure they are actually  increasing in the right direction.  The patient is in full understanding of  this plan.  She was offered the option of transfusion of platelets to  correct her counts, but between the two she prefers expectant management and  will actually try to return to work in two days' time, and if no additional  heavy bleeding occurs, hopefully we can proceed with the surgical procedure  in 1-1/2 to 2 weeks' time, pending improvement specifically in her platelet  count and bleeding time.   Discussion held with the patient at the time of discharge 30 minutes  regarding future plans.                                               Langley Gauss, M.D.    DC/MEDQ  D:  02/05/2003  T:  02/05/2003  Job:  [id number]

## 2011-09-17 LAB — CBC
Hemoglobin: 13.3
MCHC: 34.2
RBC: 4.39
RDW: 13.6

## 2011-09-17 LAB — BASIC METABOLIC PANEL
CO2: 26
Calcium: 9.5
Creatinine, Ser: 0.56
GFR calc Af Amer: >60
GFR calc non Af Amer: >60
Sodium: 138

## 2013-11-24 ENCOUNTER — Other Ambulatory Visit (HOSPITAL_COMMUNITY): Payer: Self-pay | Admitting: Internal Medicine

## 2013-11-24 DIAGNOSIS — Z139 Encounter for screening, unspecified: Secondary | ICD-10-CM

## 2013-11-25 ENCOUNTER — Ambulatory Visit (HOSPITAL_COMMUNITY)
Admission: RE | Admit: 2013-11-25 | Discharge: 2013-11-25 | Disposition: A | Discharge status: Home / Self Care | Payer: BC Managed Care – PPO | Source: Ambulatory Visit | Attending: Internal Medicine | Admitting: Internal Medicine

## 2013-11-25 DIAGNOSIS — Z139 Encounter for screening, unspecified: Secondary | ICD-10-CM

## 2013-11-25 DIAGNOSIS — Z1231 Encounter for screening mammogram for malignant neoplasm of breast: Secondary | ICD-10-CM | POA: Insufficient documentation

## 2013-11-29 ENCOUNTER — Ambulatory Visit (HOSPITAL_COMMUNITY): Payer: Self-pay

## 2014-05-05 ENCOUNTER — Encounter (INDEPENDENT_AMBULATORY_CARE_PROVIDER_SITE_OTHER): Payer: Self-pay | Admitting: *Deleted

## 2015-05-17 ENCOUNTER — Encounter (INDEPENDENT_AMBULATORY_CARE_PROVIDER_SITE_OTHER): Payer: Self-pay | Admitting: *Deleted

## 2015-06-09 ENCOUNTER — Other Ambulatory Visit (HOSPITAL_COMMUNITY): Payer: Self-pay | Admitting: Internal Medicine

## 2015-06-09 DIAGNOSIS — Z1231 Encounter for screening mammogram for malignant neoplasm of breast: Secondary | ICD-10-CM

## 2015-06-14 ENCOUNTER — Ambulatory Visit (INDEPENDENT_AMBULATORY_CARE_PROVIDER_SITE_OTHER): Payer: BC Managed Care – PPO | Admitting: Internal Medicine

## 2015-06-14 ENCOUNTER — Ambulatory Visit (HOSPITAL_COMMUNITY): Payer: BC Managed Care – PPO

## 2015-09-08 ENCOUNTER — Other Ambulatory Visit (HOSPITAL_COMMUNITY): Payer: Self-pay | Admitting: Internal Medicine

## 2015-09-08 DIAGNOSIS — Z1231 Encounter for screening mammogram for malignant neoplasm of breast: Secondary | ICD-10-CM

## 2015-09-13 ENCOUNTER — Ambulatory Visit (HOSPITAL_COMMUNITY): Payer: BC Managed Care – PPO

## 2015-10-05 ENCOUNTER — Encounter (INDEPENDENT_AMBULATORY_CARE_PROVIDER_SITE_OTHER): Payer: Self-pay | Admitting: *Deleted

## 2015-10-16 ENCOUNTER — Ambulatory Visit (HOSPITAL_COMMUNITY)
Admission: RE | Admit: 2015-10-16 | Discharge: 2015-10-16 | Disposition: A | Discharge status: Home / Self Care | Payer: BLUE CROSS/BLUE SHIELD | Source: Ambulatory Visit | Attending: Internal Medicine | Admitting: Internal Medicine

## 2015-10-16 DIAGNOSIS — Z1231 Encounter for screening mammogram for malignant neoplasm of breast: Secondary | ICD-10-CM | POA: Diagnosis present

## 2018-09-03 ENCOUNTER — Encounter (INDEPENDENT_AMBULATORY_CARE_PROVIDER_SITE_OTHER): Payer: Self-pay | Admitting: *Deleted

## 2018-09-09 ENCOUNTER — Other Ambulatory Visit (HOSPITAL_COMMUNITY): Payer: Self-pay | Admitting: Internal Medicine

## 2018-09-09 DIAGNOSIS — Z1239 Encounter for other screening for malignant neoplasm of breast: Secondary | ICD-10-CM

## 2018-09-23 ENCOUNTER — Ambulatory Visit (HOSPITAL_COMMUNITY)
Admission: RE | Admit: 2018-09-23 | Discharge: 2018-09-23 | Disposition: A | Discharge status: Home / Self Care | Payer: Managed Care, Other (non HMO) | Source: Ambulatory Visit | Attending: Internal Medicine | Admitting: Internal Medicine

## 2018-09-23 DIAGNOSIS — Z1239 Encounter for other screening for malignant neoplasm of breast: Secondary | ICD-10-CM | POA: Diagnosis not present

## 2019-07-06 ENCOUNTER — Other Ambulatory Visit: Payer: Self-pay

## 2019-07-06 ENCOUNTER — Other Ambulatory Visit: Payer: Managed Care, Other (non HMO)

## 2019-07-06 DIAGNOSIS — Z20822 Contact with and (suspected) exposure to covid-19: Secondary | ICD-10-CM

## 2019-07-11 LAB — NOVEL CORONAVIRUS, NAA: SARS-CoV-2, NAA: NOT DETECTED

## 2019-07-15 ENCOUNTER — Telehealth: Payer: Self-pay | Admitting: Internal Medicine

## 2019-07-15 NOTE — Telephone Encounter (Signed)
General/Other - results  °The patient was given negative Covid-19 results °

## 2019-12-14 ENCOUNTER — Ambulatory Visit: Payer: HRSA Program | Attending: Internal Medicine

## 2019-12-14 ENCOUNTER — Other Ambulatory Visit: Payer: Self-pay

## 2019-12-14 DIAGNOSIS — Z20822 Contact with and (suspected) exposure to covid-19: Secondary | ICD-10-CM

## 2019-12-14 DIAGNOSIS — Z20828 Contact with and (suspected) exposure to other viral communicable diseases: Secondary | ICD-10-CM | POA: Insufficient documentation

## 2019-12-15 LAB — NOVEL CORONAVIRUS, NAA: SARS-CoV-2, NAA: NOT DETECTED

## 2020-03-04 ENCOUNTER — Ambulatory Visit: Payer: Self-pay | Attending: Internal Medicine

## 2020-03-04 DIAGNOSIS — Z23 Encounter for immunization: Secondary | ICD-10-CM

## 2020-03-04 NOTE — Progress Notes (Signed)
   Covid-19 Vaccination Clinic  Name:  TRILBY WAY    MRN: [id number] DOB: [birth year]  03/04/2020  Ms. Boettcher was observed post Covid-19 immunization for 30 minutes based on pre-vaccination screening without incident. She was provided with Vaccine Information Sheet and instruction to access the V-Safe system.   Ms. Tibbett was instructed to call 911 with any severe reactions post vaccine: Marland Kitchen Difficulty breathing  . Swelling of face and throat  . A fast heartbeat  . A bad rash all over body  . Dizziness and weakness   Immunizations Administered    Name Date Dose VIS Date Route   Pfizer COVID-19 Vaccine 03/04/2020  8:41 AM 0.3 mL 12/03/2019 Intramuscular   Manufacturer: [employer], [employer]   Lot: [id number]   NDC: [address]-1

## 2020-03-25 ENCOUNTER — Ambulatory Visit: Payer: Self-pay | Attending: Internal Medicine

## 2020-03-25 DIAGNOSIS — Z23 Encounter for immunization: Secondary | ICD-10-CM

## 2020-03-25 NOTE — Progress Notes (Signed)
   Covid-19 Vaccination Clinic  Name:  Dana Vazquez    MRN: [id number] DOB: 11-20-1961  03/25/2020  Ms. Lalone was observed post Covid-19 immunization for 30 minutes based on pre-vaccination screening without incident. She was provided with Vaccine Information Sheet and instruction to access the V-Safe system.   Ms. Pellegrin was instructed to call 911 with any severe reactions post vaccine: Marland Kitchen Difficulty breathing  . Swelling of face and throat  . A fast heartbeat  . A bad rash all over body  . Dizziness and weakness   Immunizations Administered    Name Date Dose VIS Date Route   Pfizer COVID-19 Vaccine 03/25/2020  8:07 AM 0.3 mL 12/03/2019 Intramuscular   Manufacturer: [employer], [employer]   Lot: [id number]   NDC: [address]-1

## 2020-03-27 ENCOUNTER — Ambulatory Visit: Payer: Self-pay

## 2020-05-24 ENCOUNTER — Ambulatory Visit (HOSPITAL_COMMUNITY)
Admission: RE | Admit: 2020-05-24 | Discharge: 2020-05-24 | Disposition: A | Discharge status: Home / Self Care | Payer: BC Managed Care – PPO | Source: Ambulatory Visit | Attending: Internal Medicine | Admitting: Internal Medicine

## 2020-05-24 ENCOUNTER — Other Ambulatory Visit: Payer: Self-pay

## 2020-05-24 ENCOUNTER — Other Ambulatory Visit (HOSPITAL_COMMUNITY): Payer: Self-pay | Admitting: Internal Medicine

## 2020-05-24 DIAGNOSIS — M25512 Pain in left shoulder: Secondary | ICD-10-CM | POA: Insufficient documentation

## 2020-06-13 ENCOUNTER — Encounter: Payer: Self-pay | Admitting: Internal Medicine

## 2020-08-25 ENCOUNTER — Ambulatory Visit: Payer: BC Managed Care – PPO | Admitting: Gastroenterology

## 2020-10-21 ENCOUNTER — Other Ambulatory Visit: Payer: Self-pay

## 2020-10-21 ENCOUNTER — Ambulatory Visit: Payer: Self-pay | Attending: Internal Medicine

## 2020-10-21 DIAGNOSIS — Z23 Encounter for immunization: Secondary | ICD-10-CM

## 2020-10-21 NOTE — Progress Notes (Signed)
° °  Covid-19 Vaccination Clinic  Name:  Dana Vazquez    MRN: [id number] DOB: 1961/09/02  10/21/2020  [name] was observed post Covid-19 immunization for 15 minutes without incident. She was provided with Vaccine Information Sheet and instruction to access the V-Safe system.   [name] was instructed to call 911 with any severe reactions post vaccine:  Difficulty breathing   Swelling of face and throat   A fast heartbeat   A bad rash all over body   Dizziness and weakness

## 2021-07-06 ENCOUNTER — Other Ambulatory Visit (HOSPITAL_COMMUNITY): Payer: Self-pay | Admitting: Internal Medicine

## 2021-07-06 ENCOUNTER — Other Ambulatory Visit: Payer: Self-pay | Admitting: Internal Medicine

## 2021-07-06 DIAGNOSIS — Z1231 Encounter for screening mammogram for malignant neoplasm of breast: Secondary | ICD-10-CM

## 2021-07-12 ENCOUNTER — Ambulatory Visit (HOSPITAL_COMMUNITY)
Admission: RE | Admit: 2021-07-12 | Discharge: 2021-07-12 | Disposition: A | Discharge status: Home / Self Care | Payer: BC Managed Care – PPO | Source: Ambulatory Visit | Attending: Internal Medicine | Admitting: Internal Medicine

## 2021-07-12 ENCOUNTER — Other Ambulatory Visit: Payer: Self-pay

## 2021-07-12 ENCOUNTER — Other Ambulatory Visit (HOSPITAL_COMMUNITY): Payer: Self-pay | Admitting: Internal Medicine

## 2021-07-12 ENCOUNTER — Encounter (HOSPITAL_COMMUNITY): Payer: Self-pay

## 2021-07-12 DIAGNOSIS — N63 Unspecified lump in unspecified breast: Secondary | ICD-10-CM

## 2021-07-12 DIAGNOSIS — Z1231 Encounter for screening mammogram for malignant neoplasm of breast: Secondary | ICD-10-CM | POA: Insufficient documentation

## 2021-07-20 ENCOUNTER — Ambulatory Visit (HOSPITAL_COMMUNITY)
Admission: RE | Admit: 2021-07-20 | Discharge: 2021-07-20 | Disposition: A | Discharge status: Home / Self Care | Payer: BC Managed Care – PPO | Source: Ambulatory Visit | Attending: Internal Medicine | Admitting: Internal Medicine

## 2021-07-20 ENCOUNTER — Other Ambulatory Visit: Payer: Self-pay

## 2021-07-20 ENCOUNTER — Encounter (HOSPITAL_COMMUNITY): Payer: Self-pay

## 2021-07-20 DIAGNOSIS — N63 Unspecified lump in unspecified breast: Secondary | ICD-10-CM

## 2021-08-21 IMAGING — MG DIGITAL DIAGNOSTIC BILAT W/ TOMO W/ CAD
6 of 10 series · 6 of 30 positions shown · non-contrast
Comparison: Previous exam(s).

CLINICAL DATA: Patient has a small palpable lump along the lower
inner aspect of the right breast.

EXAM:
DIGITAL DIAGNOSTIC BILATERAL MAMMOGRAM WITH TOMOSYNTHESIS AND CAD;
ULTRASOUND RIGHT BREAST LIMITED
TECHNIQUE: Bilateral digital diagnostic mammography and breast tomosynthesis
was performed. The images were evaluated with computer-aided
detection.; Targeted ultrasound examination of the right breast was
performed

[L MLO synth-2D]
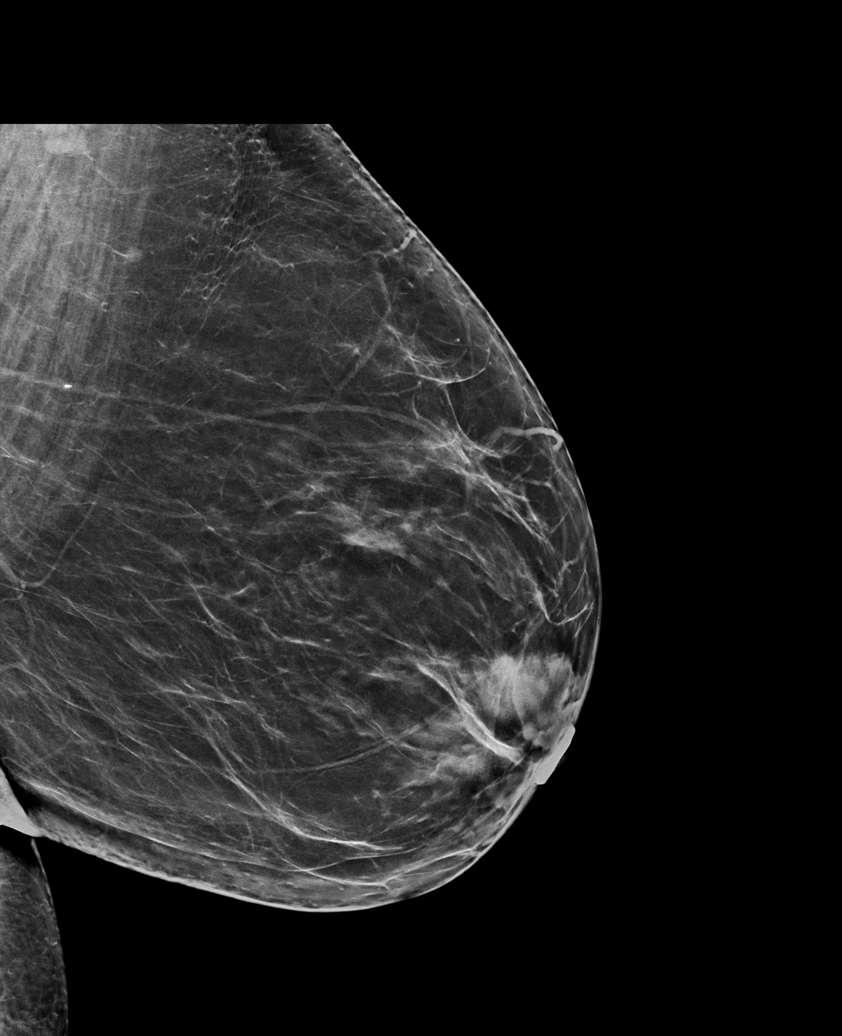

[R MLO synth-2D (1 of 2)]
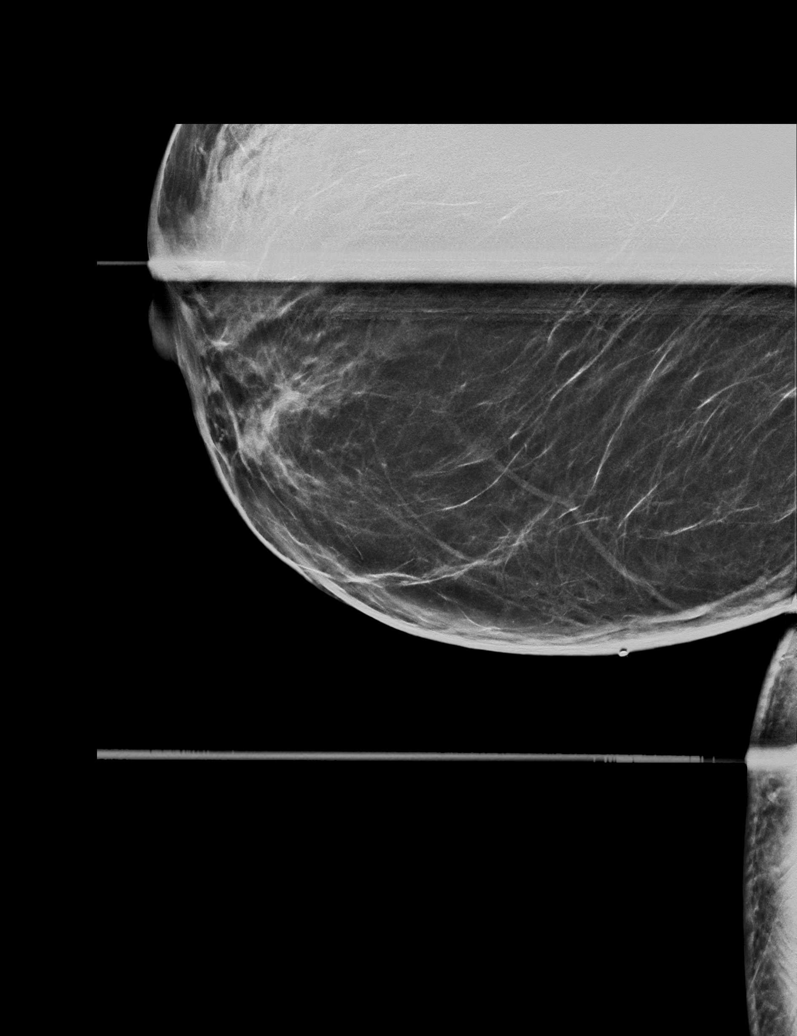

[L CC synth-2D]
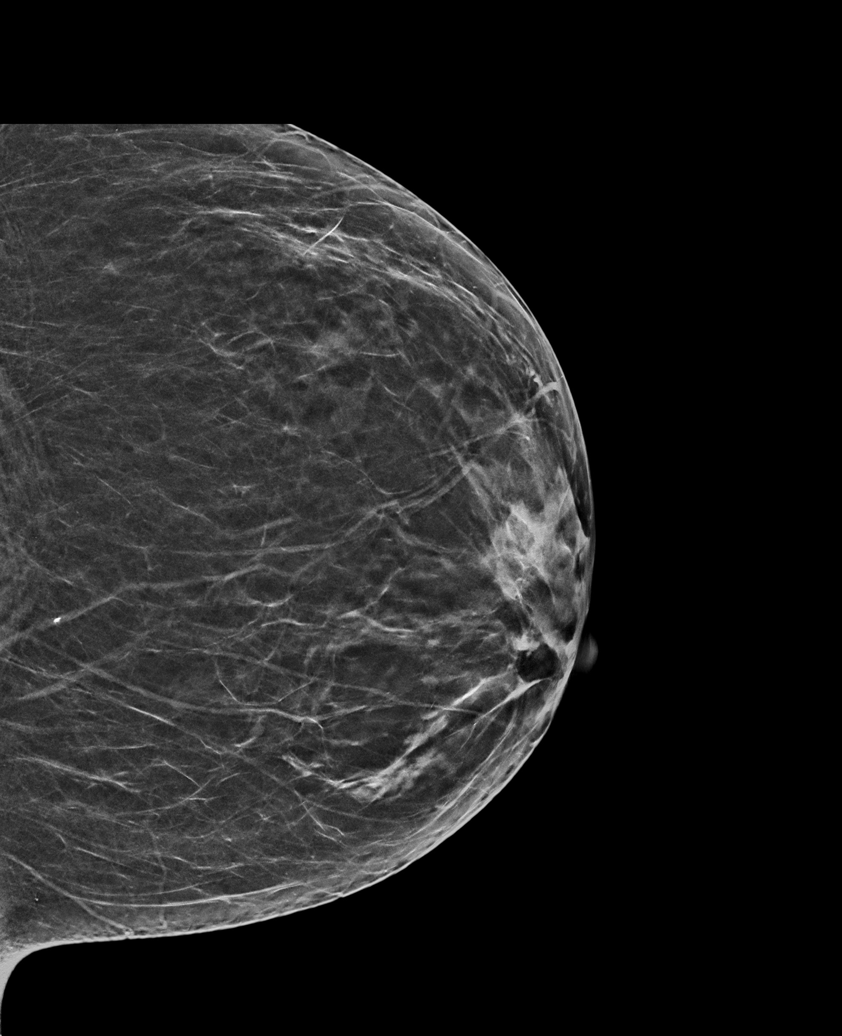

[R CC synth-2D]
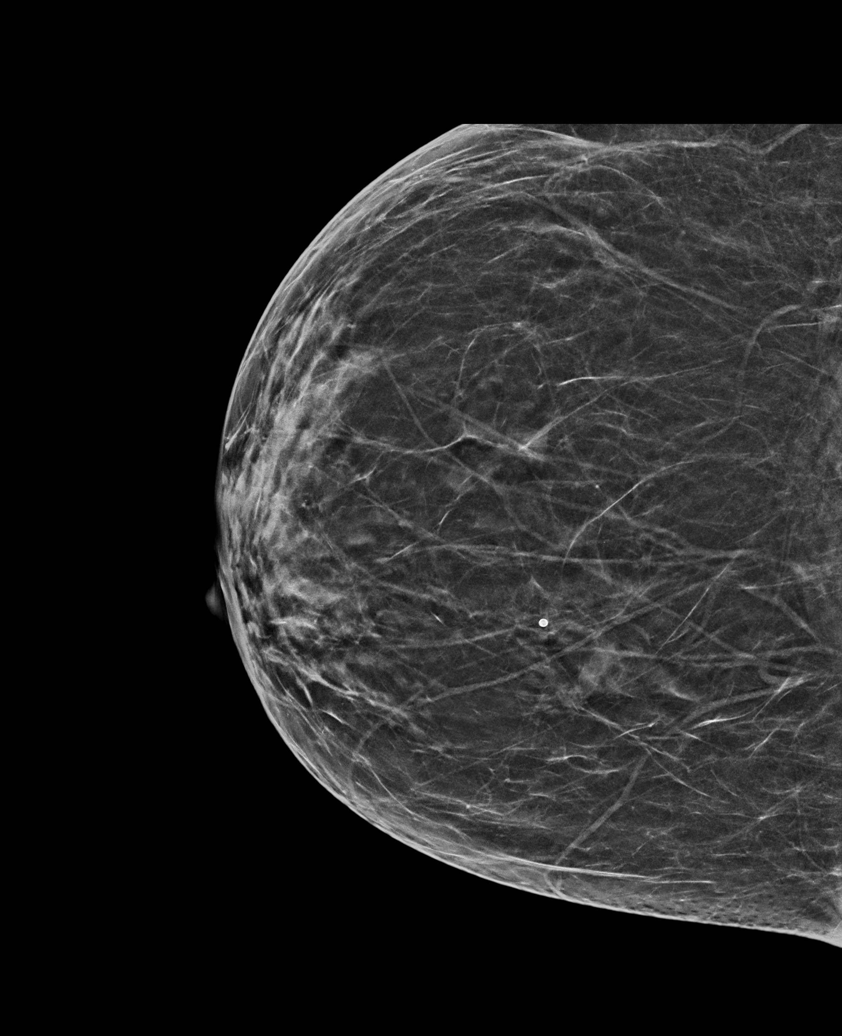

[R MLO synth-2D (2 of 2)]
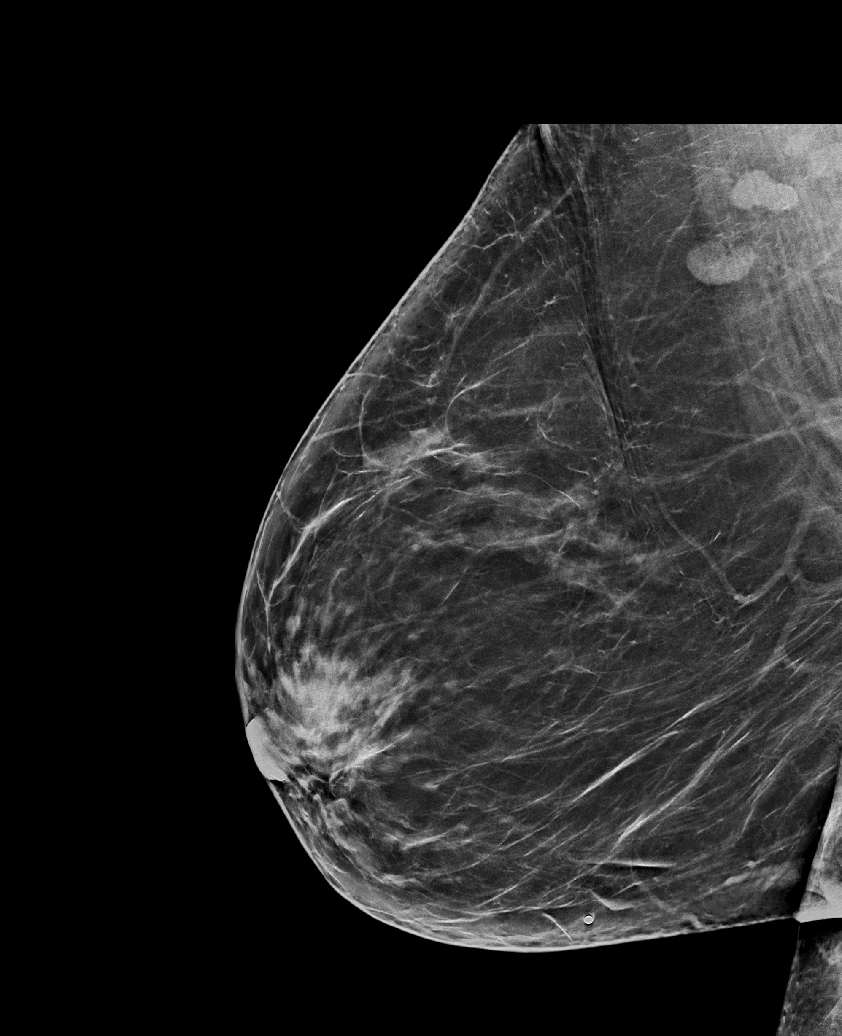

[L CC tomo · tomo slice 31/61.0]
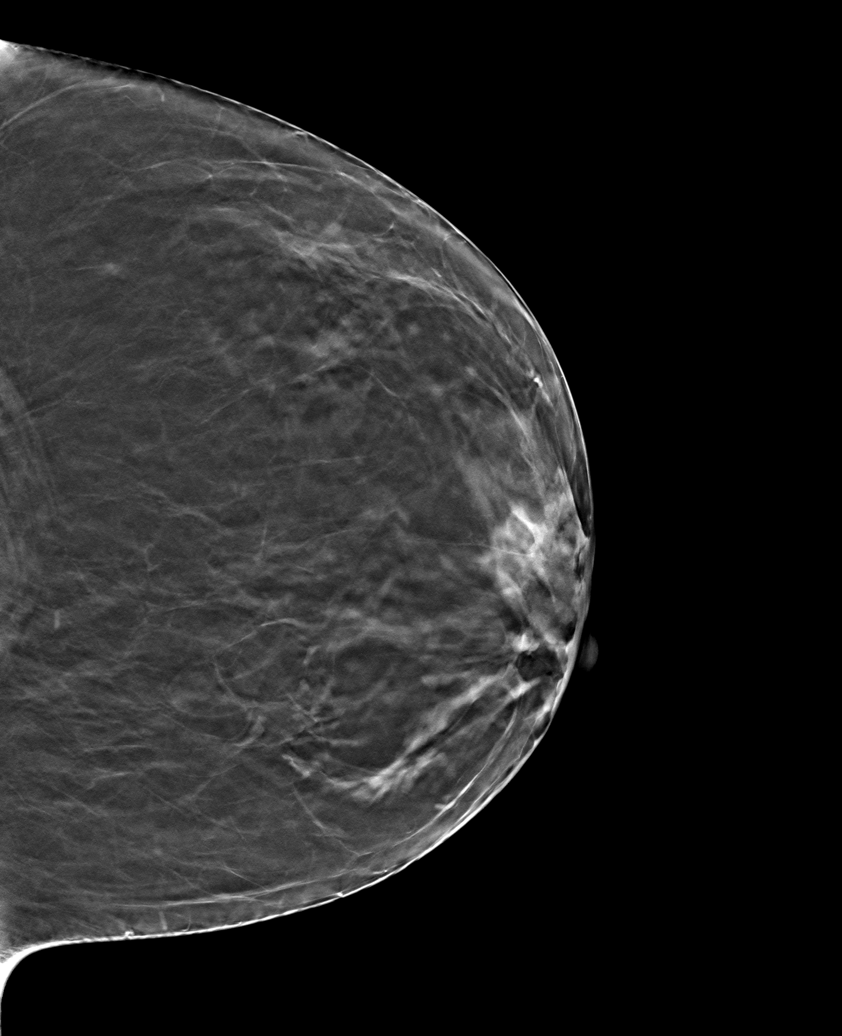

[6 of 30 positions shown; findings below may reference images not displayed]

ACR Breast Density Category b: There are scattered areas of
fibroglandular density.
FINDINGS: There are no masses, areas of architectural distortion, areas of
significant asymmetry or suspicious calcifications. No mammographic
change.

On physical exam, there is a small, firm palpable nodule mass
palpate within the skin of the lower inner right breast.

Targeted ultrasound is performed, showing a small focal hypoechoic
lesion within the skin the right breast at 5:30 o'clock, 6 cm the
nipple, with this area of skin mildly thickened. The hypoechoic area
measures 6 x 3 x 4 mm. This does not involve the underlying breast
tissue.
IMPRESSION: 1. No evidence of breast malignancy.
2. Small benign skin lesion corresponds to the palpable abnormality,
right breast at 5:30 o'clock, 6 cm the nipple, consistent with a
sebaceous cyst/epidermal inclusion cyst.

RECOMMENDATION:
Screening mammogram in one year.(Code:[id number])

I have discussed the findings and recommendations with the patient.
If applicable, a reminder letter will be sent to the patient
regarding the next appointment.

BI-RADS CATEGORY  2: Benign.

## 2021-10-24 ENCOUNTER — Other Ambulatory Visit: Payer: Self-pay | Admitting: Internal Medicine

## 2021-10-24 DIAGNOSIS — M545 Low back pain, unspecified: Secondary | ICD-10-CM

## 2022-11-21 ENCOUNTER — Other Ambulatory Visit (HOSPITAL_COMMUNITY): Payer: Self-pay | Admitting: Internal Medicine

## 2022-11-21 DIAGNOSIS — Z1231 Encounter for screening mammogram for malignant neoplasm of breast: Secondary | ICD-10-CM

## 2022-11-25 ENCOUNTER — Ambulatory Visit (HOSPITAL_COMMUNITY): Payer: BC Managed Care – PPO

## 2022-12-02 ENCOUNTER — Ambulatory Visit (HOSPITAL_COMMUNITY)
Admission: RE | Admit: 2022-12-02 | Discharge: 2022-12-02 | Disposition: A | Discharge status: Home / Self Care | Payer: 59 | Source: Ambulatory Visit | Attending: Internal Medicine | Admitting: Internal Medicine

## 2022-12-02 DIAGNOSIS — Z1231 Encounter for screening mammogram for malignant neoplasm of breast: Secondary | ICD-10-CM | POA: Insufficient documentation
